# Patient Record
Sex: Male | Born: 1965 | Race: Black or African American | Hispanic: No | Marital: Married | Smoking: Current every day smoker
Health system: Southern US, Community
[De-identification: ages and names within clinical notes are randomized; demographics above are authoritative.]

## PROBLEM LIST (undated history)

## (undated) HISTORY — PX: SHOULDER SURGERY: SHX246

---

## 2015-06-24 ENCOUNTER — Ambulatory Visit
Admission: EM | Admit: 2015-06-24 | Discharge: 2015-06-24 | Disposition: A | Discharge status: Home / Self Care | Payer: Worker's Compensation | Attending: Family Medicine | Admitting: Family Medicine

## 2015-06-24 ENCOUNTER — Ambulatory Visit: Payer: Worker's Compensation

## 2015-06-24 ENCOUNTER — Encounter: Payer: Self-pay | Admitting: Emergency Medicine

## 2015-06-24 DIAGNOSIS — F17210 Nicotine dependence, cigarettes, uncomplicated: Secondary | ICD-10-CM | POA: Diagnosis not present

## 2015-06-24 DIAGNOSIS — W228XXA Striking against or struck by other objects, initial encounter: Secondary | ICD-10-CM | POA: Diagnosis not present

## 2015-06-24 DIAGNOSIS — M25572 Pain in left ankle and joints of left foot: Secondary | ICD-10-CM | POA: Insufficient documentation

## 2015-06-24 DIAGNOSIS — T148XXA Other injury of unspecified body region, initial encounter: Secondary | ICD-10-CM

## 2015-06-24 DIAGNOSIS — S9002XA Contusion of left ankle, initial encounter: Secondary | ICD-10-CM | POA: Insufficient documentation

## 2015-06-24 MED ORDER — MELOXICAM 15 MG PO TABS: 15.0000 mg | ORAL_TABLET | Freq: Every day | ORAL | Status: AC

## 2015-06-24 MED ORDER — MELOXICAM 15 MG PO TABS: 15.0000 mg | ORAL_TABLET | Freq: Every day | ORAL | Status: DC

## 2015-06-24 NOTE — ED Provider Notes (Signed)
CSN: [id number]     Arrival date & time 06/24/15  1043 History   First MD Initiated Contact with Patient 06/24/15 1252     Chief Complaint  Patient presents with  . Worker's Comp Injury   . 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 The carts hold the breaker panels as they are being built. The rear portion of the cart is what hit his ankle. Because of continuing pain he presents for evaluation.  History reviewed. No pertinent past medical history. History reviewed. No pertinent past surgical history. History reviewed. No pertinent family history. History  Substance Use Topics  . Smoking status: Current Every Day Smoker -- 1.00 packs/day    Types: Cigarettes  . Smokeless tobacco: Never Used  . Alcohol Use: No    Review of Systems  All other systems reviewed and are negative.   Allergies  Review of patient's allergies indicates no known allergies.  Home Medications   Prior to Admission medications   Medication Sig Start Date End Date Taking? Authorizing Provider  meloxicam (MOBIC) 15 MG tablet Take 1 tablet (15 mg total) by mouth daily. 06/24/15   Chrissie NoaWilliam P Roemer, PA-C   BP 114/68 mmHg  Pulse 63  Temp(Src) 98.7 F (37.1 C) (Tympanic)  Resp 16  Ht 6' (1.829 m)  Wt 175 lb (79.379 kg)  BMI 23.73 kg/m2  SpO2 100% Physical Exam  Constitutional: He is oriented to person, place, and time. He appears well-developed and well-nourished.  HENT:  Head: Normocephalic and atraumatic.  Eyes: Pupils are equal, round, and reactive to light.  Musculoskeletal: He exhibits tenderness. He exhibits no edema.  Examination of the left ankle shows no erythema or ecchymosis or significant swelling at the ankle. There is some  mild swelling over the midfoot but this is minimal. The patient has pain points that are palpated but with distraction this is greatly minimized. He resists range of motion with pain at all extremes. He does have flexion extension and subtalar motion is smooth. No induration or crepitus.  Neurological: He is alert and oriented to person, place, and time.  Skin: Skin is warm and dry.  Psychiatric: He has a normal mood and affect. His behavior is normal. Judgment and thought content normal.  Nursing note and vitals reviewed.   ED Course  Procedures (including critical care time) Labs Review Labs Reviewed - No data to display  Imaging Review Dg Ankle Complete Left  06/24/2015   CLINICAL DATA:  Injury to ankle and lateral left lower leg. Initial encounter.  EXAM: LEFT ANKLE COMPLETE - 3+ VIEW  COMPARISON:  None.  FINDINGS: No acute fracture or dislocation identified. There is a curvilinear metallic foreign body identified in the lateral soft tissues of the left lower leg at roughly the mid calf level. This measures approximately 10 mm in length and lies just anterior to the fibula. By x-ray, there is no suggestion of overlying soft tissue abnormality or soft tissue gas and this may represent foreign body from an old injury. Correlation suggested clinically. Degenerative changes are seen of the ankle mortise, primarily involving the medial malleolus. No bony lesions.  IMPRESSION: No acute fracture. Curvilinear metallic foreign body in the lateral mid calf just anterior to the fibula. Based on appearance of overlying soft  tissues, this is more likely to be from an old injury. Correlation suggested clinically.   Electronically Signed   By: Irish Lack M.D.   On: 06/24/2015 13:22   Medications - No data to display 13:43:58 Orders Placed WR  Apply cam walker (Boot Orthosis)                     MDM   1. Left lateral ankle pain   2. Contusion    New Prescriptions   MELOXICAM (MOBIC) 15  MG TABLET    Take 1 tablet (15 mg total) by mouth daily.  Plan: 1. Test/x-ray results and diagnosis reviewed with patient 2. rx as per orders; risks, benefits, potential side effects reviewed with patient 3. Recommend supportive treatment with elevation /ice. Use boot for 1-2 weeks for comfort/protection 4. F/u prn if symptoms worsen or don't improve     Lutricia Feil, PA-C 06/24/15 1409

## 2015-06-24 NOTE — ED Notes (Signed)
Patient states that he was injured this morning at work when a cart rolled over his left ankle.

## 2015-06-24 NOTE — Discharge Instructions (Signed)
Contusion °A contusion is a deep bruise. Contusions are the result of an injury that caused bleeding under the skin. The contusion may turn blue, purple, or yellow. Minor injuries will give you a painless contusion, but more severe contusions may stay painful and swollen for a few weeks.  °CAUSES  °A contusion is usually caused by a blow, trauma, or direct force to an area of the body. °SYMPTOMS  °· Swelling and redness of the injured area. °· Bruising of the injured area. °· Tenderness and soreness of the injured area. °· Pain. °DIAGNOSIS  °The diagnosis can be made by taking a history and physical exam. An X-ray, CT scan, or MRI may be needed to determine if there were any associated injuries, such as fractures. °TREATMENT  °Specific treatment will depend on what area of the body was injured. In general, the best treatment for a contusion is resting, icing, elevating, and applying cold compresses to the injured area. Over-the-counter medicines may also be recommended for pain control. Ask your caregiver what the best treatment is for your contusion. °HOME CARE INSTRUCTIONS  °· Put ice on the injured area. °¨ Put ice in a plastic bag. °¨ Place a towel between your skin and the bag. °¨ Leave the ice on for 15-20 minutes, 3-4 times a day, or as directed by your health care provider. °· Only take over-the-counter or prescription medicines for pain, discomfort, or fever as directed by your caregiver. Your caregiver may recommend avoiding anti-inflammatory medicines (aspirin, ibuprofen, and naproxen) for 48 hours because these medicines may increase bruising. °· Rest the injured area. °· If possible, elevate the injured area to reduce swelling. °SEEK IMMEDIATE MEDICAL CARE IF:  °· You have increased bruising or swelling. °· You have pain that is getting worse. °· Your swelling or pain is not relieved with medicines. °MAKE SURE YOU:  °· Understand these instructions. °· Will watch your condition. °· Will get help right  away if you are not doing well or get worse. °Document Released: 09/08/2005 Document Revised: 12/04/2013 Document Reviewed: 10/04/2011 °Exit[employer]® Patient Information ©2015 [employer]. This information is not intended to replace advice given to you by your health care provider. Make sure you discuss any questions you have with your health care provider. ° °

## 2015-07-02 ENCOUNTER — Encounter: Payer: Self-pay | Admitting: Gynecology

## 2015-07-02 ENCOUNTER — Ambulatory Visit
Admission: EM | Admit: 2015-07-02 | Discharge: 2015-07-02 | Disposition: A | Discharge status: Home / Self Care | Payer: Worker's Compensation | Attending: Family Medicine | Admitting: Family Medicine

## 2015-07-02 DIAGNOSIS — S9002XD Contusion of left ankle, subsequent encounter: Secondary | ICD-10-CM | POA: Diagnosis not present

## 2015-07-02 NOTE — ED Notes (Signed)
[facility] Per patient left ankle injury. Patient stated pain is not getting better/ pain shooting up to the back of calf.

## 2015-07-02 NOTE — ED Provider Notes (Signed)
CSN: [id number]     Arrival date & time 07/02/15  1326 History   First MD Initiated Contact with Patient 07/02/15 1433     Chief Complaint  Patient presents with  . Follow-up   (Consider location/radiation/quality/duration/timing/severity/associated sxs/prior Treatment) HPI   Patient returns today for an unscheduled follow-up knee still having "foot and ankle pain following his industrial injury at [employer]. He's been having difficulty at work because supervisors are requiring him to wear steel toe boot by removing his orthopedic boot in order to cross the floor. 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 He is unhappy with his progress and is requesting orthopedic second opinion. Is continuing to use his anti-inflammatory medications. History reviewed. No pertinent past medical history. Past Surgical History  Procedure Laterality Date  . Shoulder surgery      right   No family history on file. History  Substance Use Topics  . Smoking status: Current Every Day Smoker -- 1.00 packs/day    Types: Cigarettes  . Smokeless tobacco: Never Used  . Alcohol Use: No    Review of Systems  All other systems reviewed and are negative.   Allergies  Review of patient's allergies indicates no known allergies.  Home Medications   Prior to Admission medications   Medication Sig Start Date End Date Taking? Authorizing Provider  meloxicam (MOBIC) 15 MG tablet Take 1 tablet (15 mg total) by mouth daily. 06/24/15  Yes Chey Rachels P Chloee Tena, PA-C   BP 109/78 mmHg  Pulse 73  Temp(Src) 98.3 F (36.8 C) (Oral)  Ht 6' (1.829 m)  Wt 175 lb (79.379 kg)  BMI 23.73 kg/m2  SpO2 100% Physical Exam  Constitutional: He is oriented to person, place, and time. He appears well-developed and well-nourished.  HENT:  Head: Normocephalic and atraumatic.  Eyes: Pupils are equal, round, and reactive to light.  Musculoskeletal:  Examination today out of the boot shows no  significant swelling ecchymosis or erythema. With distraction there is very minimal tenderness present. The Achilles is intact absent test is negative. Most tenderness is ligamentous over the lateral anterior foot. There is no crepitus or induration. Range of motion shows good flexion extension as well as subtalar motion.  Neurological: He is alert and oriented to person, place, and time.  Skin: Skin is warm and dry.  Psychiatric: He has a normal mood and affect. His behavior is normal. Judgment and thought content normal.  Nursing note and vitals reviewed.   ED Course  Procedures (including critical care time) Labs Review Labs Reviewed - No data to display  Imaging Review No results found.   MDM   1. Contusion of left ankle, subsequent encounter    I told patient today that he can leave off the boot orthosis and will fit him with a stirrup so that he can continue to wear his heel toe shoe at work. I'll continue his light duty for the rest of the week and have him return to his full duties beginning Monday, 07/07/2015. He is asked for a second opinion through an orthopedic surgeon and I think that this is reasonable since he seems to be lagging in his improvement. I'll see him in follow-up only if necessary and hope that the [employer] carrier will refer him to an orthopedist for clearance. I've encouraged him to continue with his Naprosyn.    Lutricia FeilWilliam P Trentin Knappenberger, PA-C 07/02/15 1501

## 2016-08-04 IMAGING — CR DG ANKLE COMPLETE 3+V*L*
4 series · 4 of 4 positions shown · non-contrast
Comparison: None.

CLINICAL DATA: Injury to ankle and lateral left lower leg. Initial
encounter.

EXAM:
LEFT ANKLE COMPLETE - 3+ VIEW

[ankle ap]
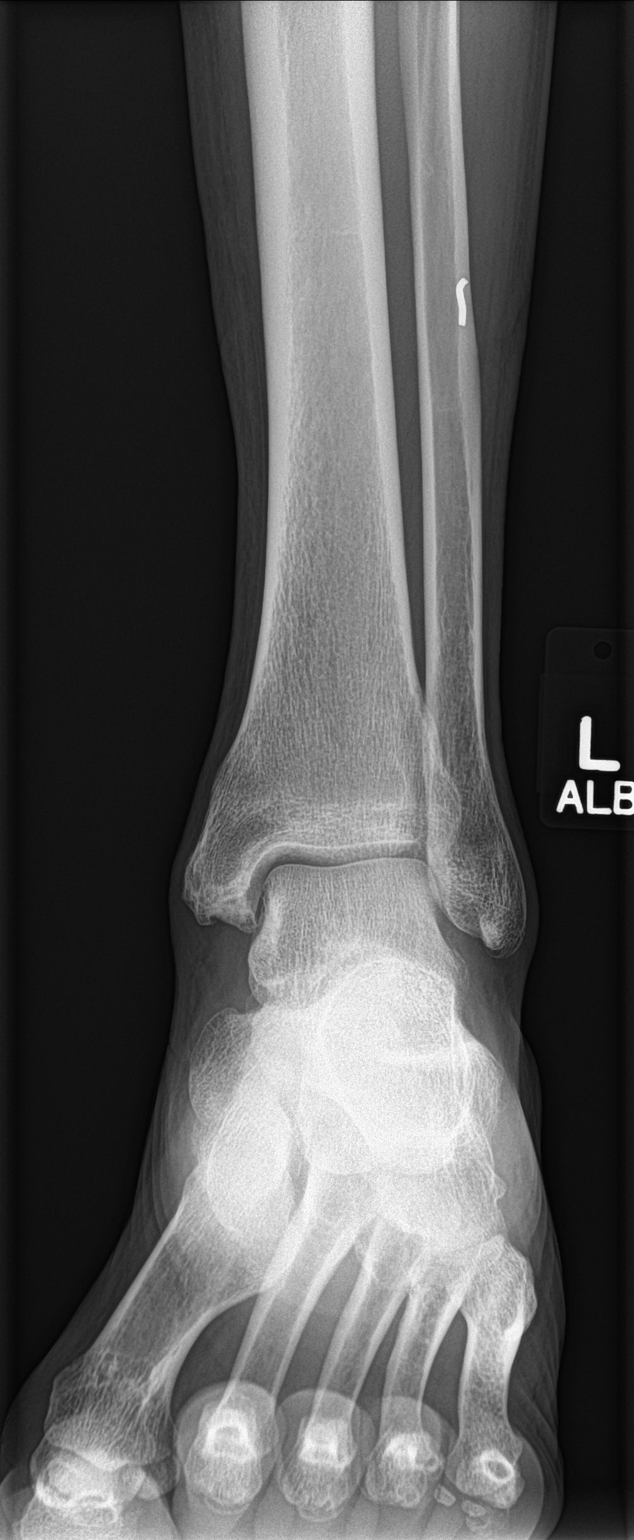

[ankle obl (1 of 2)]
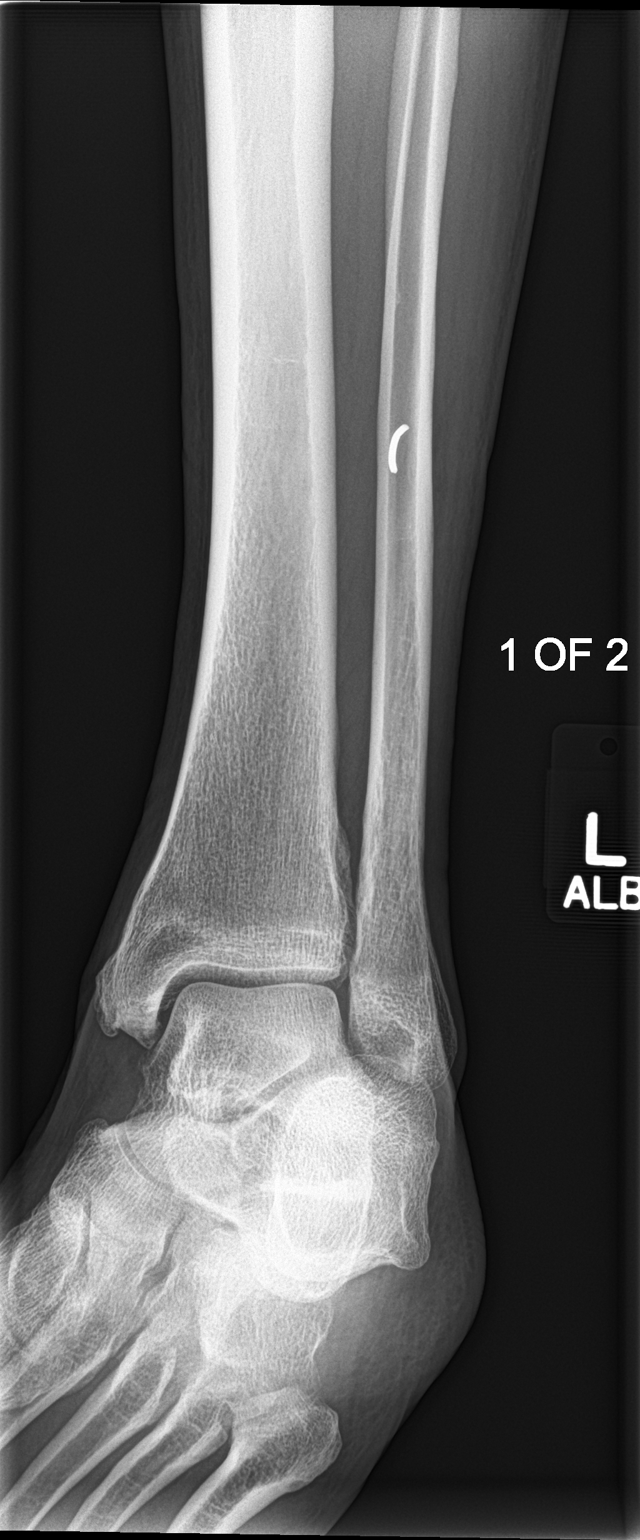

[ankle obl (2 of 2)]
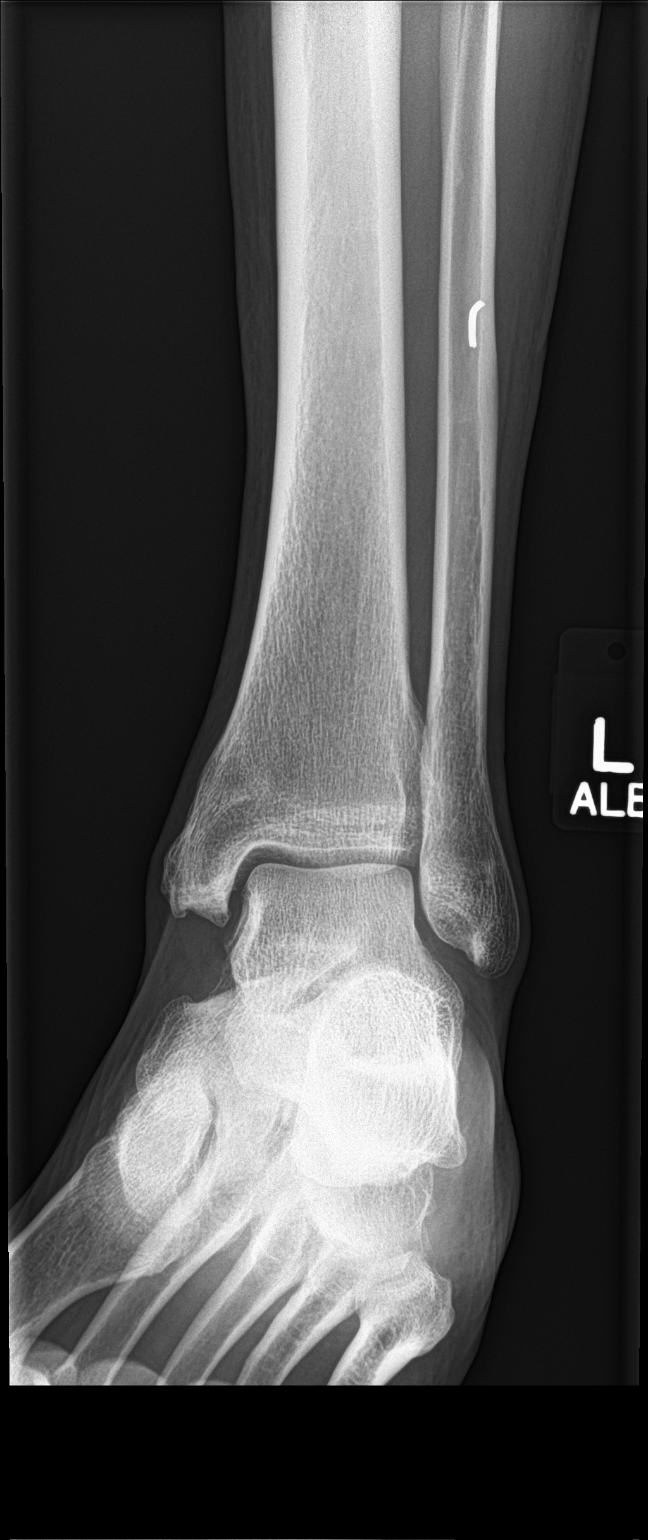

[ankle lat]
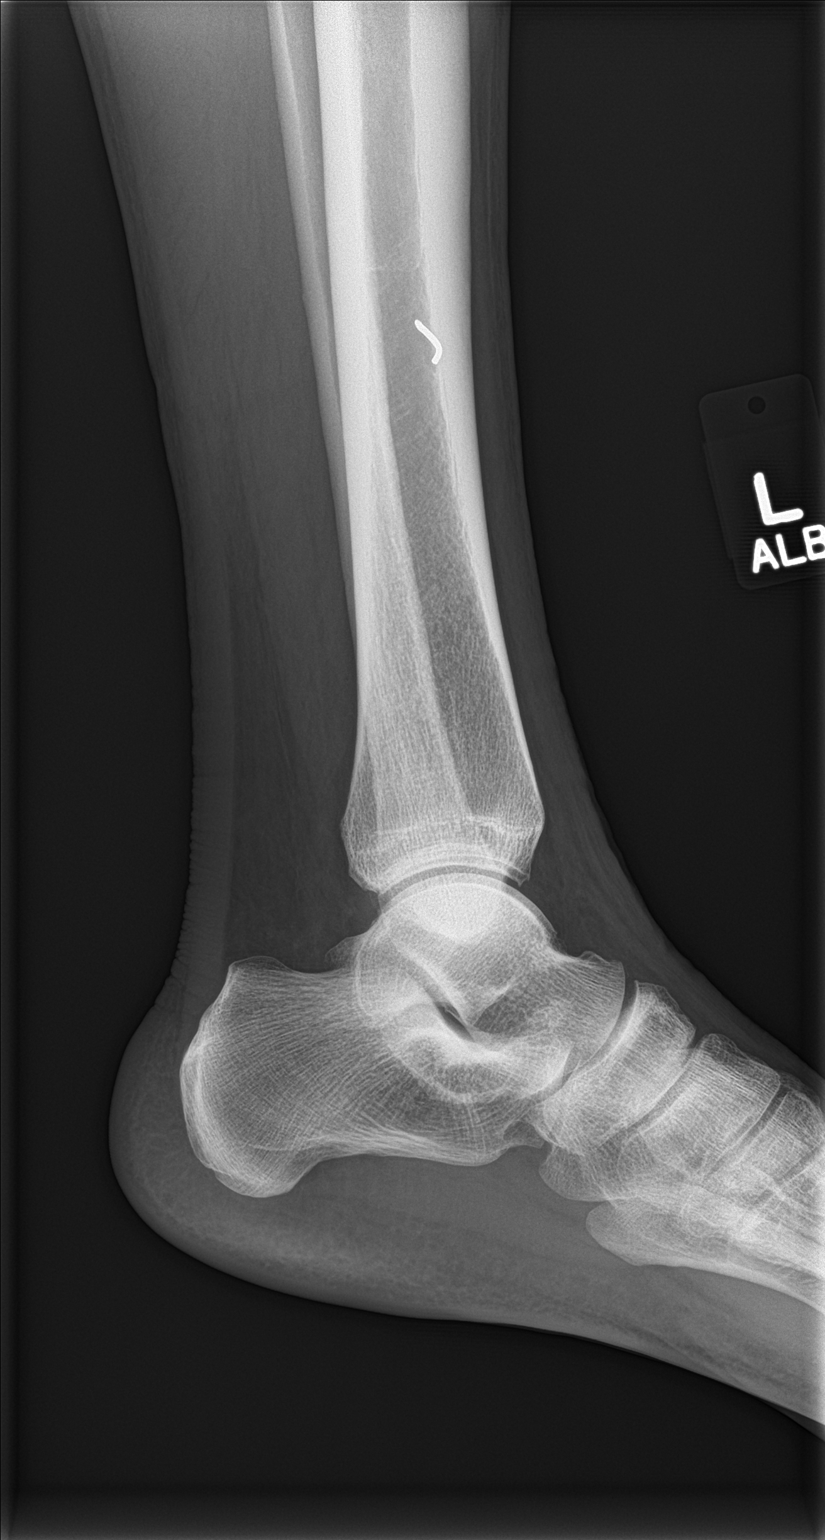

[4 of 4 positions shown; findings below may reference images not displayed]

FINDINGS: No acute fracture or dislocation identified. There is a curvilinear
metallic foreign body identified in the lateral soft tissues of the
left lower leg at roughly the mid calf level. This measures
approximately 10 mm in length and lies just anterior to the fibula.
By x-ray, there is no suggestion of overlying soft tissue
abnormality or soft tissue gas and this may represent foreign body
from an old injury. Correlation suggested clinically. Degenerative
changes are seen of the ankle mortise, primarily involving the
medial malleolus. No bony lesions.
IMPRESSION: No acute fracture. Curvilinear metallic foreign body in the lateral
mid calf just anterior to the fibula. Based on appearance of
overlying soft tissues, this is more likely to be from an old
injury. Correlation suggested clinically.

## 2021-01-08 ENCOUNTER — Ambulatory Visit: Payer: Self-pay | Admitting: Family Medicine

## 2021-01-08 ENCOUNTER — Other Ambulatory Visit: Payer: Self-pay

## 2021-01-08 ENCOUNTER — Encounter: Payer: Self-pay | Admitting: Family Medicine

## 2021-01-08 DIAGNOSIS — Z792 Long term (current) use of antibiotics: Secondary | ICD-10-CM

## 2021-01-08 DIAGNOSIS — Z113 Encounter for screening for infections with a predominantly sexual mode of transmission: Secondary | ICD-10-CM

## 2021-01-08 LAB — GRAM STAIN

## 2021-01-08 MED ORDER — DOXYCYCLINE HYCLATE 100 MG PO TABS: 100.0000 mg | ORAL_TABLET | Freq: Two times a day (BID) | ORAL | 0 refills | Status: AC

## 2021-01-08 NOTE — Progress Notes (Signed)
   [facility] Department STI clinic/screening visit  Subjective:  James Church is a 55 y.o. male being seen today for an STI screening visit. The patient reports they do have symptoms.    Patient has the following medical conditions:  There are no problems to display for this patient.    Chief Complaint  Patient presents with  . SEXUALLY TRANSMITTED DISEASE    HPI  Patient reports having discharge x 2 days.  Patient was seen at [facility] with in the last 2 months x 2  and was treated for Trich.     See flowsheet for further details and programmatic requirements.    The following portions of the patient's history were reviewed and updated as appropriate: allergies, current medications, past medical history, past social history, past surgical history and problem list.  Objective:  There were no vitals filed for this visit.  Physical Exam Constitutional:      Appearance: Normal appearance.  HENT:     Head: Normocephalic.     Mouth/Throat:     Mouth: Mucous membranes are moist.     Pharynx: Oropharynx is clear. No oropharyngeal exudate.  Genitourinary:    Comments: No lice, nits, or pest, no lesions or odor discharge.  Denies pain or tenderness with paplation of testicles.  No lesions, ulcers or masses present.   Uncircumcised  Musculoskeletal:     Cervical back: Normal range of motion.  Lymphadenopathy:     Cervical: No cervical adenopathy.  Skin:    General: Skin is warm and dry.     Findings: No bruising, erythema, lesion or rash.  Neurological:     Mental Status: He is alert and oriented to person, place, and time.  Psychiatric:        Mood and Affect: Mood normal.        Behavior: Behavior normal.       Assessment and Plan:  James Church is a 55 y.o. male presenting to the [facility] Department for STI screening  1. Screening examination for venereal disease - Syphilis Serology, [facility] - HIV/HCV [place] [facility] - Gram stain -  Gonococcus culture  2. Prophylactic antibiotic   Patient has been seen at [facility] in the past 60 days and treated twice for Trich.  Patient reports still having a discharge.  Gram stain negative will treat empirically for chlamydia.     Patient has - doxycycline (VIBRA-TABS) 100 MG tablet; Take 1 tablet (100 mg total) by mouth 2 (two) times daily for 7 days.  Dispense: 14 tablet; Refill: 0  Patient does have STI symptoms Patient accepted all screenings including GC, gram stain and bloodwork for HIV/RPR/ HCV Patient meets criteria for HepB screening? No. Ordered? No - does not meet critera  Patient meets criteria for HepC screening? Yes. Ordered? Yes   Recommended condom use with all sex Discussed importance of condom use for STI prevent   Discussed time line for [facility] results and that patient will be called with positive results and encouraged patient to call if he had not heard in 2 weeks  Recommended returning for continued or worsening symptoms.     Return if symptoms worsen or fail to improve, for as needed.  No future appointments.  Wendi Snipes, FNP

## 2021-01-08 NOTE — Progress Notes (Signed)
Treated per provider order. Jossie Ng, RN

## 2021-01-13 LAB — GONOCOCCUS CULTURE

## 2021-01-14 LAB — HM HEPATITIS C SCREENING LAB: HM Hepatitis Screen: NEGATIVE

## 2021-01-14 LAB — HM HIV SCREENING LAB: HM HIV Screening: NEGATIVE

## 2021-01-26 ENCOUNTER — Telehealth: Payer: Self-pay | Admitting: Family Medicine

## 2021-01-26 NOTE — Telephone Encounter (Signed)
Pt was treated for an STI on 01/08/21 and wants to speak with the provider because he is still having symptoms.

## 2021-01-26 NOTE — Telephone Encounter (Signed)
Call to client and negative test results from 01/08/2021 provided. Per client, completed 7 days of antibiotic (Doxycycline - empiric treatment) received at [facility] 1/27/20922, but continuing to have similar symptoms. Per consult with Sadie Haber PA, client has had testing agency can do and in light of continuing symptoms, client needs evaluation by PCP. Client counseled regarding above and states has PCP / health insurance. Jossie Ng, RN
